# Patient Record
Sex: Female | Born: 1935 | Race: White | Hispanic: No | Smoking: Former smoker
Health system: Southern US, Community
[De-identification: ages and names within clinical notes are randomized; demographics above are authoritative.]

## PROBLEM LIST (undated history)

## (undated) DIAGNOSIS — J45909 Unspecified asthma, uncomplicated: Secondary | ICD-10-CM

## (undated) DIAGNOSIS — M5430 Sciatica, unspecified side: Secondary | ICD-10-CM

## (undated) DIAGNOSIS — L719 Rosacea, unspecified: Secondary | ICD-10-CM

## (undated) DIAGNOSIS — L309 Dermatitis, unspecified: Secondary | ICD-10-CM

## (undated) DIAGNOSIS — L509 Urticaria, unspecified: Secondary | ICD-10-CM

## (undated) DIAGNOSIS — I10 Essential (primary) hypertension: Secondary | ICD-10-CM

## (undated) DIAGNOSIS — M549 Dorsalgia, unspecified: Secondary | ICD-10-CM

## (undated) DIAGNOSIS — IMO0002 Reserved for concepts with insufficient information to code with codable children: Secondary | ICD-10-CM

## (undated) DIAGNOSIS — G8929 Other chronic pain: Secondary | ICD-10-CM

## (undated) DIAGNOSIS — E785 Hyperlipidemia, unspecified: Secondary | ICD-10-CM

## (undated) DIAGNOSIS — I639 Cerebral infarction, unspecified: Secondary | ICD-10-CM

## (undated) HISTORY — DX: Hyperlipidemia, unspecified: E78.5

## (undated) HISTORY — DX: Essential (primary) hypertension: I10

## (undated) HISTORY — DX: Unspecified asthma, uncomplicated: J45.909

## (undated) HISTORY — DX: Urticaria, unspecified: L50.9

## (undated) HISTORY — DX: Reserved for concepts with insufficient information to code with codable children: IMO0002

## (undated) HISTORY — DX: Other chronic pain: G89.29

## (undated) HISTORY — DX: Sciatica, unspecified side: M54.30

## (undated) HISTORY — DX: Dermatitis, unspecified: L30.9

## (undated) HISTORY — DX: Cerebral infarction, unspecified: I63.9

## (undated) HISTORY — DX: Dorsalgia, unspecified: M54.9

## (undated) HISTORY — DX: Rosacea, unspecified: L71.9

---

## 1981-07-24 DIAGNOSIS — I639 Cerebral infarction, unspecified: Secondary | ICD-10-CM

## 1981-07-24 HISTORY — DX: Cerebral infarction, unspecified: I63.9

## 2006-03-30 ENCOUNTER — Ambulatory Visit: Payer: Self-pay | Admitting: Family Medicine

## 2006-07-02 ENCOUNTER — Other Ambulatory Visit: Admission: RE | Admit: 2006-07-02 | Discharge: 2006-07-02 | Payer: Self-pay | Admitting: Family Medicine

## 2006-07-02 ENCOUNTER — Encounter: Payer: Self-pay | Admitting: Family Medicine

## 2006-07-02 ENCOUNTER — Ambulatory Visit: Payer: Self-pay | Admitting: Family Medicine

## 2006-08-03 ENCOUNTER — Ambulatory Visit: Payer: Self-pay | Admitting: Family Medicine

## 2006-08-03 LAB — CONVERTED CEMR LAB
ALT: 12 units/L (ref 0–40)
Basophils Absolute: 0 10*3/uL (ref 0.0–0.1)
Basophils Relative: 0.5 % (ref 0.0–1.0)
Creatinine, Ser: 0.9 mg/dL (ref 0.4–1.2)
Eosinophil percent: 1.8 % (ref 0.0–5.0)
GFR calc non Af Amer: 64 mL/min
HCT: 44.8 % (ref 36.0–46.0)
Hemoglobin: 14.5 g/dL (ref 12.0–15.0)
Lymphocytes Relative: 32.4 % (ref 12.0–46.0)
MCV: 91 fL (ref 78.0–100.0)
Monocytes Absolute: 0.6 10*3/uL (ref 0.2–0.7)
Neutro Abs: 3.8 10*3/uL (ref 1.4–7.7)
Neutrophils Relative %: 56.9 % (ref 43.0–77.0)
Platelets: 354 10*3/uL (ref 150–400)
Triglyceride fasting, serum: 92 mg/dL (ref 0–149)
VLDL: 18 mg/dL (ref 0–40)
Vit D, 1,25-Dihydroxy: 37 (ref 20–57)
WBC: 6.6 10*3/uL (ref 4.5–10.5)

## 2008-05-18 ENCOUNTER — Ambulatory Visit: Payer: Self-pay | Admitting: Family Medicine

## 2008-05-18 DIAGNOSIS — J45909 Unspecified asthma, uncomplicated: Secondary | ICD-10-CM | POA: Insufficient documentation

## 2008-05-18 DIAGNOSIS — E785 Hyperlipidemia, unspecified: Secondary | ICD-10-CM

## 2008-05-18 DIAGNOSIS — I10 Essential (primary) hypertension: Secondary | ICD-10-CM

## 2008-05-18 DIAGNOSIS — E559 Vitamin D deficiency, unspecified: Secondary | ICD-10-CM | POA: Insufficient documentation

## 2008-05-18 DIAGNOSIS — J309 Allergic rhinitis, unspecified: Secondary | ICD-10-CM

## 2008-05-20 LAB — CONVERTED CEMR LAB
AST: 26 units/L (ref 0–37)
Albumin: 3.8 g/dL (ref 3.5–5.2)
Basophils Absolute: 0 10*3/uL (ref 0.0–0.1)
Basophils Relative: 0.1 % (ref 0.0–3.0)
CO2: 32 meq/L (ref 19–32)
Calcium: 8.9 mg/dL (ref 8.4–10.5)
Cholesterol: 176 mg/dL (ref 0–200)
GFR calc Af Amer: 89 mL/min
GFR calc non Af Amer: 73 mL/min
Hemoglobin: 15.1 g/dL — ABNORMAL HIGH (ref 12.0–15.0)
LDL Cholesterol: 112 mg/dL — ABNORMAL HIGH (ref 0–99)
Lymphocytes Relative: 27.6 % (ref 12.0–46.0)
MCHC: 34.3 g/dL (ref 30.0–36.0)
MCV: 89.4 fL (ref 78.0–100.0)
Neutro Abs: 4.5 10*3/uL (ref 1.4–7.7)
Phosphorus: 3 mg/dL (ref 2.3–4.6)
RBC: 4.94 M/uL (ref 3.87–5.11)
Sodium: 142 meq/L (ref 135–145)
TSH: 0.58 microintl units/mL (ref 0.35–5.50)
Total Protein: 7.4 g/dL (ref 6.0–8.3)

## 2008-11-02 ENCOUNTER — Ambulatory Visit: Payer: Self-pay | Admitting: Family Medicine

## 2009-10-25 ENCOUNTER — Telehealth: Payer: Self-pay | Admitting: Family Medicine

## 2009-10-29 ENCOUNTER — Ambulatory Visit: Payer: Self-pay | Admitting: Family Medicine

## 2009-10-29 DIAGNOSIS — L509 Urticaria, unspecified: Secondary | ICD-10-CM

## 2009-11-01 ENCOUNTER — Encounter: Payer: Self-pay | Admitting: Family Medicine

## 2009-11-01 LAB — CONVERTED CEMR LAB
ALT: 12 units/L (ref 0–35)
AST: 22 units/L (ref 0–37)
BUN: 15 mg/dL (ref 6–23)
Basophils Relative: 0 % (ref 0–1)
Cholesterol: 162 mg/dL (ref 0–200)
Creatinine, Ser: 0.91 mg/dL (ref 0.40–1.20)
Eosinophils Absolute: 0.1 10*3/uL (ref 0.0–0.7)
Eosinophils Relative: 1 % (ref 0–5)
HDL: 52 mg/dL (ref >39)
MCHC: 32.4 g/dL (ref 30.0–36.0)
MCV: 90.9 fL (ref 78.0–100.0)
Neutrophils Relative %: 60 % (ref 43–77)
Platelets: 351 10*3/uL (ref 150–400)
RDW: 13.4 % (ref 11.5–15.5)
TSH: 0.705 microintl units/mL (ref 0.350–4.500)
Total Bilirubin: 0.5 mg/dL (ref 0.3–1.2)
Total CHOL/HDL Ratio: 3.1
VLDL: 21 mg/dL (ref 0–40)
Vit D, 25-Hydroxy: 56 ng/mL (ref 30–89)

## 2010-08-23 NOTE — Assessment & Plan Note (Signed)
Summary: med refill/ alc   Vital Signs:  Patient profile:   77 year old female Weight:      180.75 pounds Temp:     98.2 degrees F oral Pulse rate:   80 / minute Pulse rhythm:   regular BP sitting:   132 / 82  (right arm) Cuff size:   large  Vitals Entered By: Linde Gillis CMA Duncan Dull) (October 29, 2009 1:53 PM) CC: medication refill   History of Present Illness: here for f/u of HTN and lipids and vit D def  wt is down 8 lb from last visit  has really been trying   wants to get lab work   has chronic itching for 2 years -- lesions on her arms - ? 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 - this worries her  never had any symptoms from it  had abx - and was treated   has continued her vit D -- otc , does actually get 50,000 international units per week -- but never came back for her labs - no transportation    Allergies: 1)  Asa  Past History:  Family History: Last updated: 05/18/2008 mother liver cancer and arthritis  half brother with colon cancer   Social History: Last updated: 05/18/2008 former smoker  married twice - 2nd husband died in 11-30-22 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 urticaria    chiropractor --  Review of Systems General:  Denies fatigue, fever, loss of appetite, and malaise. Eyes:  Denies blurring and eye irritation. ENT:  Denies sinus pressure and sore throat. CV:  Denies chest pain or discomfort, lightheadness, palpitations, and shortness of breath with  exertion. Resp:  Denies cough, shortness of breath, and wheezing. GI:  Denies abdominal pain, bloody stools, and change in bowel habits. GU:  Denies dysuria, hematuria, and urinary frequency. MS:  Complains of joint pain and stiffness; denies joint redness, joint swelling, and muscle aches. Derm:  Complains of itching and rash; denies lesion(s) and poor wound healing. Neuro:  Denies numbness and tingling. Psych:  Denies anxiety and depression. Endo:  Denies cold intolerance, excessive thirst, excessive urination, and heat intolerance. Heme:  Denies abnormal bruising and bleeding. Allergy:  Complains of hives or rash and seasonal allergies.  Physical Exam  General:  overweight but generally well appearing  Head:  normocephalic, atraumatic, and no abnormalities observed.   Eyes:  vision grossly intact, pupils equal, pupils round, and pupils reactive to light.  no conjunctival pallor, injection or icterus  Ears:  R ear normal and L ear normal.   Nose:  nares are boggy with some clear rhinorrhea  Mouth:  pharynx pink and moist.   Neck:  supple with full rom and no masses or thyromegally, no JVD or carotid bruit  Chest Wall:  No deformities, masses, or tenderness noted. Lungs:  Normal respiratory effort, chest expands symmetrically. Lungs are clear to auscultation, no crackles or wheezes. Heart:  Normal rate and regular rhythm. S1  and S2 normal without gallop, murmur, click, rub or other extra sounds. Abdomen:  Bowel sounds positive,abdomen soft and non-tender without masses, organomegaly or hernias noted. no renal bruits  Msk:  No deformity or scoliosis noted of thoracic or lumbar spine.  no acute joint changes Pulses:  R and L carotid,radial,femoral,dorsalis pedis and posterior tibial pulses are full and equal bilaterally Extremities:  No clubbing, cyanosis, edema, or deformity noted with normal full range of motion of all joints.   Neurologic:  sensation intact to light touch, gait normal,  and DTRs symmetrical and normal.   Skin:  notable dermographia on arms - red marks after being touched  scars on upper shoulders from picking few red whelps erythema on waistline and under bra - also whelp- like  Cervical Nodes:  No lymphadenopathy noted Inguinal Nodes:  No significant adenopathy Psych:  very talkative and tangential  seems generally unhappy but not depressed good eye contact    Impression & Recommendations:  Problem # 1:  URTICARIA (ICD-708.9) Assessment New bothersome with sensitive skin and dermographia and occ wheeze strong fam hx of allergies offered allergist ref- she will think about it and call back  in meantime - antihistamines prn  Problem # 2:  UNSPECIFIED VITAMIN D DEFICIENCY (ICD-268.9) Assessment: Unchanged per pt on 50,000 international units otc weekly  check level and update disc importance of compliance and follow up of this  Orders: Venipuncture (16109) T-Renal Function Panel [id number]) T-Lipid Profile [id number]) T-Hepatic Function [id number]) T-CBC w/Diff ([id number]) T-TSH ([id number]) T- * Misc. Laboratory test [id number]) Specimen Handling (03474) Specimen Handling (25956)  Problem # 3:  HYPERTENSION (ICD-401.9) Assessment: Unchanged  well controlled with atenolol urged to stay active and keep working on wt loss  Her updated medication list for this problem includes:    Atenolol 25 Mg Tabs (Atenolol) .Marland Kitchen... 1 by mouth once daily  Orders: Venipuncture (38756) T-Renal Function Panel [id number]) T-Lipid Profile [id number]) T-Hepatic Function [id number]) T-CBC w/Diff [id number]) T-TSH [id number]) T- * Misc. Laboratory test [id number]) Specimen Handling (73710)  BP today: 132/82 Prior BP: 130/80 (05/18/2008)  Labs Reviewed: K+: 4.1 (05/18/2008) Creat: : 0.8 (05/18/2008)   Chol: 176 (05/18/2008)   HDL: 48.2 (05/18/2008)   LDL: 112 (05/18/2008)   TG: 79 (05/18/2008)  Problem # 4:  HYPERLIPIDEMIA  (ICD-272.4) Assessment: Unchanged  diet controlled not bad at last check rev sat fats in diet  lab today and adv  Orders: Venipuncture (62694) T-Renal Function Panel [id number]) T-Lipid Profile [id number]) T-Hepatic Function [id number]) T-CBC w/Diff (10175-10258) T-TSH [id number]) T- * Misc. Laboratory test [id number]) Specimen Handling (31540)  Labs Reviewed: SGOT: 26 (05/18/2008)   SGPT: 14 (05/18/2008)   HDL:48.2 (05/18/2008), 48.4 (08/03/2006)  LDL:112 (05/18/2008), 111 (08/03/2006)  Chol:176 (05/18/2008), 178 (08/03/2006)  Trig:79 (05/18/2008), 92 (08/03/2006)  Complete Medication List: 1)  Atenolol 25 Mg Tabs (Atenolol) .Marland Kitchen.. 1 by mouth once daily 2)  Calcium  .... Over the counter 3)  Fish Oil  .... 2 by mouth once daily over the counter 4)  Vitamin C (? 500 Mg)  .... 2 by mouth once daily 5)  Zinc  .... 1 by mouth once daily over the counter 6)  Claritin 10 Mg Tabs (Loratadine) .Marland Kitchen.. 1 by mouth once daily as needed runny nose or itchy skin 7)  Vit D 10,000 Iu  .... 5 by mouth times one one time weekly  Patient Instructions: 1)  we will check labs today 2)  keep working on healthy diet and weight loss  3)  blood pressure  is ok today  4)  if or when you are interested in seeing an allergist for your rash and wheezing- just call and I will do a referral  5)  I sent your px to your pharmacy  Prescriptions: ATENOLOL 25 MG TABS (ATENOLOL) 1 by mouth once daily  #30 x 11   Entered and Authorized by:   Judith Part MD   Signed by:   Judith Part MD on 10/29/2009   Method used:   Electronically to        [id number]       [address]       [place], [place]  [address]       Ph: [phone] or [phone]       Fax: [phone]   RxID:   [id number]   Current Allergies (reviewed today): ASA

## 2010-08-23 NOTE — Progress Notes (Signed)
Summary: Atenolol  Phone Note Refill Request Call back at Home Phone [phone] Message from:  Patient on October 25, 2009 5:00 PM  Refills Requested: Medication #1:  ATENOLOL 25 MG TABS 1 by mouth once daily Patient has an appt. on Friday, but only has enough med for today. Can she get enough to last until she is seen. Uses [facility] [address]   Initial call taken by: Melody Comas,  October 25, 2009 5:01 PM Call For: Judith Part MD  Follow-up for Phone Call        px written on EMR for call in  Follow-up by: Judith Part MD,  October 25, 2009 5:13 PM  Additional Follow-up for Phone Call Additional follow up Details #1::        Patient notified as instructed by telephone. Medication phoned to [facility] [address] [facility] as instructed. Lewanda Rife LPN  October 25, 2128 5:39 PM     New/Updated Medications: ATENOLOL 25 MG TABS (ATENOLOL) 1 by mouth once daily Prescriptions: ATENOLOL 25 MG TABS (ATENOLOL) 1 by mouth once daily  #30 x 0   Entered and Authorized by:   Judith Part MD   Signed by:   Lewanda Rife LPN on 86/57/8469   Method used:   Telephoned to ...         RxID:   [id number]

## 2010-08-23 NOTE — Miscellaneous (Signed)
Summary: Vitamin D 2000iu update  Clinical Lists Changes  Medications: Added new medication of VITAMIN D 2000 UNIT TABS (CHOLECALCIFEROL) take two Tablets by mouth daily Removed medication of * VIT D 10,000 IU 5 by mouth times one one time weekly     Current Allergies: ASA

## 2010-11-15 ENCOUNTER — Other Ambulatory Visit: Payer: Self-pay | Admitting: *Deleted

## 2010-11-15 MED ORDER — ATENOLOL 25 MG PO TABS: 25.0000 mg | ORAL_TABLET | Freq: Every day | ORAL | Status: DC

## 2010-11-15 NOTE — Telephone Encounter (Signed)
Rx refilled.

## 2010-11-17 ENCOUNTER — Other Ambulatory Visit: Payer: Self-pay | Admitting: *Deleted

## 2010-11-17 NOTE — Telephone Encounter (Signed)
Opened in error

## 2011-01-03 ENCOUNTER — Other Ambulatory Visit: Payer: Self-pay | Admitting: Family Medicine

## 2011-01-03 NOTE — Telephone Encounter (Signed)
I think she is due for f/u (? Over a year) Please schedule f/u when able Px written for call in

## 2011-01-06 ENCOUNTER — Other Ambulatory Visit: Payer: Self-pay | Admitting: Family Medicine

## 2011-01-09 ENCOUNTER — Other Ambulatory Visit: Payer: Self-pay | Admitting: *Deleted

## 2011-01-09 MED ORDER — ATENOLOL 25 MG PO TABS: 25.0000 mg | ORAL_TABLET | Freq: Every day | ORAL | Status: DC

## 2011-03-21 ENCOUNTER — Other Ambulatory Visit: Payer: Self-pay | Admitting: Family Medicine

## 2011-03-21 NOTE — Telephone Encounter (Signed)
[facility] [address] electronically request refill Atenolol 25mg  #30 x 1. Pt already scheduled 04/21/11 for CPX with Dr Milinda Antis.

## 2011-04-14 ENCOUNTER — Other Ambulatory Visit (INDEPENDENT_AMBULATORY_CARE_PROVIDER_SITE_OTHER): Payer: Medicare PPO

## 2011-04-14 DIAGNOSIS — E7800 Pure hypercholesterolemia, unspecified: Secondary | ICD-10-CM

## 2011-04-14 DIAGNOSIS — J309 Allergic rhinitis, unspecified: Secondary | ICD-10-CM

## 2011-04-14 DIAGNOSIS — E559 Vitamin D deficiency, unspecified: Secondary | ICD-10-CM

## 2011-04-14 DIAGNOSIS — I10 Essential (primary) hypertension: Secondary | ICD-10-CM

## 2011-04-14 LAB — BASIC METABOLIC PANEL
GFR: 64.18 mL/min (ref >60.00)
Glucose, Bld: 94 mg/dL (ref 70–99)
Potassium: 4.1 mEq/L (ref 3.5–5.1)
Sodium: 142 mEq/L (ref 135–145)

## 2011-04-14 LAB — HEPATIC FUNCTION PANEL
AST: 23 U/L (ref 0–37)
Albumin: 4 g/dL (ref 3.5–5.2)
Alkaline Phosphatase: 52 U/L (ref 39–117)
Bilirubin, Direct: 0 mg/dL (ref 0.0–0.3)
Total Bilirubin: 0.6 mg/dL (ref 0.3–1.2)

## 2011-04-14 LAB — LIPID PANEL
LDL Cholesterol: 74 mg/dL (ref 0–99)
Total CHOL/HDL Ratio: 3
VLDL: 16.6 mg/dL (ref 0.0–40.0)

## 2011-04-14 LAB — TSH: TSH: 0.25 u[IU]/mL — ABNORMAL LOW (ref 0.35–5.50)

## 2011-04-14 LAB — CBC WITH DIFFERENTIAL/PLATELET
Basophils Absolute: 0 10*3/uL (ref 0.0–0.1)
Eosinophils Relative: 1.9 % (ref 0.0–5.0)
MCV: 91.1 fl (ref 78.0–100.0)
Monocytes Absolute: 0.6 10*3/uL (ref 0.1–1.0)
Neutrophils Relative %: 60.7 % (ref 43.0–77.0)
Platelets: 319 10*3/uL (ref 150.0–400.0)
WBC: 6.4 10*3/uL (ref 4.5–10.5)

## 2011-04-15 LAB — VITAMIN D 25 HYDROXY (VIT D DEFICIENCY, FRACTURES): Vit D, 25-Hydroxy: 38 ng/mL (ref 30–89)

## 2011-04-20 ENCOUNTER — Encounter: Payer: Self-pay | Admitting: Family Medicine

## 2011-04-21 ENCOUNTER — Ambulatory Visit (INDEPENDENT_AMBULATORY_CARE_PROVIDER_SITE_OTHER): Payer: Medicare PPO | Admitting: Family Medicine

## 2011-04-21 ENCOUNTER — Encounter: Payer: Self-pay | Admitting: Family Medicine

## 2011-04-21 VITALS — BP 124/84 | HR 80 | Temp 97.8°F | Ht <= 58 in | Wt 171.2 lb

## 2011-04-21 DIAGNOSIS — E049 Nontoxic goiter, unspecified: Secondary | ICD-10-CM

## 2011-04-21 DIAGNOSIS — R946 Abnormal results of thyroid function studies: Secondary | ICD-10-CM

## 2011-04-21 DIAGNOSIS — H269 Unspecified cataract: Secondary | ICD-10-CM

## 2011-04-21 DIAGNOSIS — R7989 Other specified abnormal findings of blood chemistry: Secondary | ICD-10-CM | POA: Insufficient documentation

## 2011-04-21 DIAGNOSIS — I10 Essential (primary) hypertension: Secondary | ICD-10-CM

## 2011-04-21 DIAGNOSIS — E559 Vitamin D deficiency, unspecified: Secondary | ICD-10-CM

## 2011-04-21 DIAGNOSIS — E785 Hyperlipidemia, unspecified: Secondary | ICD-10-CM

## 2011-04-21 LAB — T3 UPTAKE: T3 Uptake: 36.9 % (ref 22.5–37.0)

## 2011-04-21 NOTE — Assessment & Plan Note (Signed)
Stable level Urged to continue current dose

## 2011-04-21 NOTE — Assessment & Plan Note (Signed)
Refer for this and vision exam- pt is having more problems

## 2011-04-21 NOTE — Progress Notes (Signed)
Subjective:    Patient ID: Yesenia Gomez, female    DOB: [birth year], 77 y.o.   MRN: [id number]  HPI Here for check up of chronic med problems and to review health mt list   Is upset because she has lost inches in ht  Stress- son is bed ridden with strokes   HTN in good control wth 124/84 today on tenormin , nl pulse No cp or ha or palpitations   Wt is down 9 lb with bmi of 36 Not a lot of appetite  Nutrition- is generally eating less  She does like to cook - that is how she gains her weight   Activity - takes care of her son   Has cataracts and needs check up of eyes (saw Dr Larence Penning in past - wants to see someone else)   Lots of allergy problems  Lipids are good  Lab Results  Component Value Date   CHOL 145 04/14/2011   CHOL 162 10/29/2009   CHOL 176 05/18/2008   Lab Results  Component Value Date   HDL 54.00 04/14/2011   HDL 52 10/29/2009   HDL 48.2 05/18/2008   Lab Results  Component Value Date   LDLCALC 74 04/14/2011   LDLCALC 89 10/29/2009   LDLCALC 112* 05/18/2008   Lab Results  Component Value Date   TRIG 83.0 04/14/2011   TRIG 103 10/29/2009   TRIG 79 05/18/2008   Lab Results  Component Value Date   CHOLHDL 3 04/14/2011   CHOLHDL 3.1 Ratio 10/29/2009   CHOLHDL 3.7 CALC 05/18/2008   No results found for this basename: LDLDIRECT   she generally eats a little less Has to watch out for the pastries   tsh low today - this is new  Thought she may have had a goiter in the past  No trouble sleeping, not hyper , but a little more anxious   Colon ca screening - no interested   Zoster status- never had shingles and ? If chicken pox ? If interested in vaccine   Flu shot- declines  Pneumovax- declines  Td 07  Mammogram - had one time and does not want any more  Does not want a breast exam   Pap neg in 12/07 Tends to get yeast infections  Has to stay away from acidic foods and beverages - uses otc meds  Does not want a gyn exam today   Review of Systems  Review of  Systems  Constitutional: Negative for fever, appetite change, fatigue and unexpected weight change.  Eyes: Negative for pain and pos for blurry vision  Respiratory: Negative for cough and shortness of breath.   Cardiovascular: Negative for cp or palpitations    Gastrointestinal: Negative for nausea, diarrhea and constipation.  Genitourinary: Negative for urgency and frequency.  Skin: Negative for pallor or rash   Neurological: Negative for weakness, light-headedness, numbness and headaches.  Hematological: Negative for adenopathy. Does not bruise/bleed easily.  Psychiatric/Behavioral: Negative for dysphoric mood. Some mild anx, no SI      Patient Active Problem List  Diagnoses  . UNSPECIFIED VITAMIN D DEFICIENCY  . HYPERLIPIDEMIA  . HYPERTENSION  . ALLERGIC RHINITIS  . ASTHMA  . URTICARIA  . Low TSH level  . Cataracts, bilateral  . Goiter   Past Medical History  Diagnosis Date  . CVA (cerebral vascular accident) 1983    no residual deficits  . HTN (hypertension)   . Sciatica   . Rosacea   . HLD (hyperlipidemia)   .  Vitamin D deficiency   . DDD (degenerative disc disease)     LS; C5  . Chronic back pain   . Allergic rhinitis   . Dermatitis   . Urticaria, unspecified   . Unspecified asthma    No past surgical history on file. History  Substance Use Topics  . Smoking status: Former Games developer  . Smokeless tobacco: Not on file  . Alcohol Use: Not on file   Family History  Problem Relation Age of Onset  . Liver cancer Mother   . Arthritis Mother   . Colon cancer Brother     Half brother   Allergies  Allergen Reactions  . Aspirin     REACTION: reaction not known   Current Outpatient Prescriptions on File Prior to Visit  Medication Sig Dispense Refill  . atenolol (TENORMIN) 25 MG tablet TAKE 1 TABLET (25 MG TOTAL) BY MOUTH DAILY.  30 tablet  1  . Calcium Carbonate-Vitamin D (CALCIUM 600+D PO) Take by mouth as directed.        . Cholecalciferol (VITAMIN D) 2000  UNITS tablet Take 4,000 Units by mouth daily.        . fish oil-omega-3 fatty acids 1000 MG capsule Take 2 g by mouth daily.        . vitamin C (ASCORBIC ACID) 500 MG tablet Take 1,000 mg by mouth daily.        Marland Kitchen loratadine (CLARITIN) 10 MG tablet Take 10 mg by mouth daily as needed.        . Multiple Vitamins-Minerals (ZINC PO) Take by mouth daily.              Objective:   Physical Exam  Constitutional: She appears well-developed and well-nourished. No distress.       overwt and well appearing   HENT:  Head: Normocephalic and atraumatic.  Eyes: Conjunctivae and EOM are normal. Pupils are equal, round, and reactive to light. Right eye exhibits no discharge. Left eye exhibits no discharge.  Neck: Normal range of motion. Neck supple. No JVD present. Carotid bruit is not present. No tracheal deviation present. Thyromegaly present.  Cardiovascular: Normal rate, regular rhythm, normal heart sounds and intact distal pulses.  Exam reveals no gallop.   Pulmonary/Chest: Effort normal and breath sounds normal. No respiratory distress. She has no wheezes.  Abdominal: Soft. Bowel sounds are normal. She exhibits no distension, no abdominal bruit and no mass. There is no tenderness.  Musculoskeletal: Normal range of motion. She exhibits no edema and no tenderness.  Lymphadenopathy:    She has no cervical adenopathy.  Neurological: She is alert. She has normal reflexes. No cranial nerve deficit. She exhibits normal muscle tone. Coordination normal.  Skin: Skin is warm and dry. No rash noted. No erythema. No pallor.  Psychiatric: She has a normal mood and affect.          Assessment & Plan:

## 2011-04-21 NOTE — Assessment & Plan Note (Signed)
bp is well controlled with tenormin Will continue to monitor Enc further wt loss and good exercise

## 2011-04-21 NOTE — Assessment & Plan Note (Signed)
This is new and noted thyroid enl? On L Thyroid prof today Also thyroid US

## 2011-04-21 NOTE — Patient Instructions (Addendum)
We will do eye doctor referral at check out  If you are interested in shingles vaccine in future - call your insurance company to see how coverage is and call us to schedule  Labs look ok  Stay as active as you can  If you want to see someone for your foot/ toe problem- you can make an appointment with Dr Patsy Lager (our sports medicine doctorI) in the future  More thyroid labs today- your thyroid may be overactive  Also we will refer you for ultrasound of thyroid- you may have a goiter

## 2011-04-21 NOTE — Assessment & Plan Note (Signed)
Low tsh and L sided thyroid fullness noted on exam  Thyroid panel today Ref for [place]  Then advise

## 2011-04-21 NOTE — Assessment & Plan Note (Signed)
This is well controlled by diet Rev lab with pt  Rev low sat fat diet

## 2011-05-08 ENCOUNTER — Other Ambulatory Visit: Payer: Self-pay | Admitting: *Deleted

## 2011-05-08 MED ORDER — ATENOLOL 25 MG PO TABS: ORAL_TABLET | ORAL | Status: DC

## 2011-06-06 ENCOUNTER — Ambulatory Visit: Payer: Self-pay | Admitting: Ophthalmology

## 2011-06-07 ENCOUNTER — Other Ambulatory Visit: Payer: Self-pay | Admitting: Family Medicine

## 2011-06-07 NOTE — Telephone Encounter (Signed)
[facility] [address] request refill Atenolol 25 mg # 30 x 6. Pt seen on 04/21/11.

## 2011-06-14 ENCOUNTER — Ambulatory Visit: Payer: Self-pay | Admitting: Ophthalmology

## 2011-09-25 ENCOUNTER — Other Ambulatory Visit: Payer: Self-pay | Admitting: Family Medicine

## 2012-05-05 ENCOUNTER — Other Ambulatory Visit: Payer: Self-pay | Admitting: Family Medicine

## 2012-05-06 NOTE — Telephone Encounter (Signed)
Ok to refill? No recent appt or labs

## 2012-05-06 NOTE — Telephone Encounter (Signed)
Please schedule a f/u appt this fall or winter and refil med until then, thanks

## 2012-05-06 NOTE — Telephone Encounter (Signed)
Called pt but no answer and no voicemail set up, will try to call back later 

## 2012-05-06 NOTE — Telephone Encounter (Signed)
Please make pt f/u appt fall or winter and refil med until then, thanks

## 2012-05-06 NOTE — Telephone Encounter (Signed)
Ok to refill? No recent appt or labs 

## 2012-05-06 NOTE — Telephone Encounter (Signed)
appt scheduled for 06/07/12, med refilled till appt.

## 2012-06-07 ENCOUNTER — Encounter: Payer: Self-pay | Admitting: Family Medicine

## 2012-06-07 ENCOUNTER — Ambulatory Visit (INDEPENDENT_AMBULATORY_CARE_PROVIDER_SITE_OTHER): Payer: Medicare PPO | Admitting: Family Medicine

## 2012-06-07 VITALS — BP 135/80 | HR 62 | Temp 98.0°F | Ht <= 58 in | Wt 168.5 lb

## 2012-06-07 DIAGNOSIS — R7989 Other specified abnormal findings of blood chemistry: Secondary | ICD-10-CM

## 2012-06-07 DIAGNOSIS — E785 Hyperlipidemia, unspecified: Secondary | ICD-10-CM

## 2012-06-07 DIAGNOSIS — E559 Vitamin D deficiency, unspecified: Secondary | ICD-10-CM

## 2012-06-07 DIAGNOSIS — I10 Essential (primary) hypertension: Secondary | ICD-10-CM

## 2012-06-07 DIAGNOSIS — R946 Abnormal results of thyroid function studies: Secondary | ICD-10-CM

## 2012-06-07 DIAGNOSIS — E049 Nontoxic goiter, unspecified: Secondary | ICD-10-CM

## 2012-06-07 LAB — CBC WITH DIFFERENTIAL/PLATELET
Basophils Absolute: 0 10*3/uL (ref 0.0–0.1)
Basophils Relative: 0 % (ref 0–1)
Eosinophils Absolute: 0.1 10*3/uL (ref 0.0–0.7)
HCT: 43 % (ref 36.0–46.0)
Hemoglobin: 14.9 g/dL (ref 12.0–15.0)
MCH: 30.2 pg (ref 26.0–34.0)
MCHC: 34.7 g/dL (ref 30.0–36.0)
Monocytes Absolute: 0.8 10*3/uL (ref 0.1–1.0)
Monocytes Relative: 9 % (ref 3–12)
Neutro Abs: 5.3 10*3/uL (ref 1.7–7.7)
Neutrophils Relative %: 60 % (ref 43–77)
RDW: 14 % (ref 11.5–15.5)

## 2012-06-07 LAB — COMPREHENSIVE METABOLIC PANEL
AST: 19 U/L (ref 0–37)
Alkaline Phosphatase: 59 U/L (ref 39–117)
BUN: 18 mg/dL (ref 6–23)
Glucose, Bld: 90 mg/dL (ref 70–99)
Potassium: 4.5 mEq/L (ref 3.5–5.3)
Total Bilirubin: 0.5 mg/dL (ref 0.3–1.2)

## 2012-06-07 NOTE — Progress Notes (Signed)
Subjective:    Patient ID: Yesenia Gomez, female    DOB: Oct 06, 1924, 78 y.o.   MRN: [id number]  HPI Here for f/u of chronic health problems   bp is up  today  No cp or palpitations or headaches or edema  No side effects to medicines  BP Readings from Last 3 Encounters:  06/07/12 158/88  04/21/11 124/84  10/29/09 132/82    She blames increased bp on stress- also took claritin D  Living with in laws - difficult  Argues about pets quite a bit - has to confine her dog - a lot of stressors   She is a bit allergic to her dog - some skin itching  Also mail and newsprint bother her  Has tried benadryl Chlorpheniramine 4 mg works better  Her eyes get itchy in the mornings also  Had her cataract removed - that went well  Wt  is down 3 lb with bmi of 36  Hx of suspected goiter/ thyroid enlargement , and she never had the ultrasound she was referred for  Also low tsh Lab Results  Component Value Date   TSH 0.20* 04/21/2011   she does get leg cramps at night  She never went for her thyroid US because she was sick  She also has transportation issues and she cares for her bedridden son (cva and DM and htn) Will refer her again today    Lipids Lab Results  Component Value Date   CHOL 145 04/14/2011   HDL 54.00 04/14/2011   LDLCALC 74 04/14/2011   TRIG 83.0 04/14/2011   CHOLHDL 3 04/14/2011   pretty good at last check    She declines flu and pneumonia vaccines Declines all screening or health mt   Patient Active Problem List  Diagnosis  . UNSPECIFIED VITAMIN D DEFICIENCY  . HYPERLIPIDEMIA  . HYPERTENSION  . ALLERGIC RHINITIS  . ASTHMA  . URTICARIA  . Low TSH level  . Cataracts, bilateral  . Goiter   Past Medical History  Diagnosis Date  . CVA (cerebral vascular accident) 1983    no residual deficits  . HTN (hypertension)   . Sciatica   . Rosacea   . HLD (hyperlipidemia)   . Vitamin D deficiency   . DDD (degenerative disc disease)     LS; C5  . Chronic back pain   .  Allergic rhinitis   . Dermatitis   . Urticaria, unspecified   . Unspecified asthma    No past surgical history on file. History  Substance Use Topics  . Smoking status: Former Games developer  . Smokeless tobacco: Not on file  . Alcohol Use: Yes     Comment: rare wine   Family History  Problem Relation Age of Onset  . Liver cancer Mother   . Arthritis Mother   . Colon cancer Brother     Half brother   Allergies  Allergen Reactions  . Aspirin     REACTION: reaction not known   Current Outpatient Prescriptions on File Prior to Visit  Medication Sig Dispense Refill  . atenolol (TENORMIN) 25 MG tablet TAKE 1 TABLET (25 MG TOTAL) BY MOUTH DAILY.  30 tablet  6  . Calcium Carbonate-Vitamin D (CALCIUM 600+D PO) Take 2 tablets by mouth as directed.       . chlorpheniramine (CHLOR-TRIMETON) 4 MG tablet Take 2 mg by mouth daily as needed.        . Cholecalciferol (VITAMIN D) 2000 UNITS tablet Take 4,000 Units by mouth  daily.        . Cyanocobalamin (VITAMIN B 12 PO) Take 1 tablet by mouth daily.        . fish oil-omega-3 fatty acids 1000 MG capsule Take 2 g by mouth daily.        Marland Kitchen loratadine (CLARITIN) 10 MG tablet Take 10 mg by mouth daily as needed.        . Multiple Vitamins-Minerals (ZINC PO) Take by mouth daily.        . vitamin C (ASCORBIC ACID) 500 MG tablet Take 1,000 mg by mouth daily.        . [DISCONTINUED] atenolol (TENORMIN) 25 MG tablet TAKE ONE TABLET BY MOUTH DAILY.  30 tablet  0    Review of Systems Review of Systems  Constitutional: Negative for fever, appetite change, fatigue and unexpected weight change.  Eyes: Negative for pain and visual disturbance.  Respiratory: Negative for cough and shortness of breath.   Cardiovascular: Negative for cp or palpitations    Gastrointestinal: Negative for nausea, diarrhea and constipation.  Genitourinary: Negative for urgency and frequency.  Skin: Negative for pallor and pos for itchy dry skin  Neurological: Negative for weakness,  light-headedness, numbness and headaches.  Hematological: Negative for adenopathy. Does not bruise/bleed easily.  Psychiatric/Behavioral: Negative for dysphoric mood. The patient is not nervous/anxious.  pos for a lot of stressors        Objective:   Physical Exam  Constitutional: She appears well-developed and well-nourished. No distress.  HENT:  Head: Normocephalic and atraumatic.  Mouth/Throat: Oropharynx is clear and moist.  Eyes: Conjunctivae normal and EOM are normal. Pupils are equal, round, and reactive to light. Right eye exhibits no discharge. Left eye exhibits no discharge. No scleral icterus.  Neck: Normal range of motion. Neck supple. No JVD present. Carotid bruit is not present. Thyromegaly present.       Palpable fullness in L neck - suspect goiter/ thyroid nodule nontender  Cardiovascular: Normal rate, regular rhythm, normal heart sounds and intact distal pulses.  Exam reveals no gallop.   Abdominal: Soft. Bowel sounds are normal. She exhibits no distension, no abdominal bruit and no mass. There is no tenderness.  Musculoskeletal: She exhibits no edema.  Lymphadenopathy:    She has no cervical adenopathy.  Neurological: She is alert. She has normal reflexes. No cranial nerve deficit. She exhibits normal muscle tone. Coordination normal.  Skin: Skin is warm and dry. No rash noted. No erythema. No pallor.  Psychiatric: She has a normal mood and affect.       Pleasant but very tangential - difficult to follow at times           Assessment & Plan:

## 2012-06-07 NOTE — Patient Instructions (Addendum)
Labs today We will refer you for thyroid ultrasound at check out  Stay as active as you can be

## 2012-06-08 LAB — VITAMIN D 25 HYDROXY (VIT D DEFICIENCY, FRACTURES): Vit D, 25-Hydroxy: 33 ng/mL (ref 30–89)

## 2012-06-09 NOTE — Assessment & Plan Note (Signed)
Mass felt in L side of neck seems stable - suspect thyroid enl or nodule  Pt did not follow through with thyroid US that was sched last year- so was ref again  tsh also has been low  Pt has transportation issues- she has difficulty following up or going to doctors in general  Lab today

## 2012-06-09 NOTE — Assessment & Plan Note (Signed)
Lab today  Disc imp to bone and overall health 

## 2012-06-09 NOTE — Assessment & Plan Note (Signed)
bp is up today - but pt had taken some claritin D  Will continue to monitor- pt indicates it has been fine No symptoms Disc lifestyle habits Lab today

## 2012-06-09 NOTE — Assessment & Plan Note (Signed)
Re check this and thyroid US  Pt will likely need to see endocrinologist - but she will probably decline this  No symptoms but she has lost some wt

## 2012-06-09 NOTE — Assessment & Plan Note (Signed)
Lipids were very good at last check  Diet is fair

## 2012-06-10 ENCOUNTER — Encounter: Payer: Self-pay | Admitting: *Deleted

## 2012-07-02 ENCOUNTER — Other Ambulatory Visit: Payer: Self-pay | Admitting: Family Medicine

## 2012-07-05 ENCOUNTER — Ambulatory Visit: Payer: Self-pay | Admitting: Family Medicine

## 2012-07-08 ENCOUNTER — Encounter: Payer: Self-pay | Admitting: Family Medicine

## 2012-07-11 ENCOUNTER — Telehealth: Payer: Self-pay | Admitting: Family Medicine

## 2012-07-11 DIAGNOSIS — E049 Nontoxic goiter, unspecified: Secondary | ICD-10-CM

## 2012-07-11 DIAGNOSIS — R7989 Other specified abnormal findings of blood chemistry: Secondary | ICD-10-CM

## 2012-07-11 NOTE — Telephone Encounter (Signed)
Endocrine ref  

## 2012-07-11 NOTE — Telephone Encounter (Signed)
Message copied by Judy Pimple on Thu Jul 11, 2012  3:33 PM ------      Message from: Shon Millet      Created: Thu Jul 11, 2012 12:33 PM       Pt said that is does want to talk to the endocrinologist she just needs an appt after holidays, I advise pt that Shirlee Limerick would call her and scheduled the appt with a time that works for her, please pt referral in

## 2012-08-06 ENCOUNTER — Ambulatory Visit: Payer: Medicare PPO | Admitting: Internal Medicine

## 2013-01-25 ENCOUNTER — Other Ambulatory Visit: Payer: Self-pay | Admitting: Family Medicine

## 2013-01-27 NOTE — Telephone Encounter (Signed)
Please schedule follow up (medicare PE if she wants one) for fall and refill until then, thanks

## 2013-01-27 NOTE — Telephone Encounter (Signed)
Electronic refill request, no recent/future appt., please advise  

## 2013-01-29 NOTE — Telephone Encounter (Signed)
Called pt and left message with family to have pt call office

## 2013-01-31 NOTE — Telephone Encounter (Signed)
Called pt and no answer and no voicemail set up 

## 2013-02-04 NOTE — Telephone Encounter (Signed)
F/u scheduled to discuss several issues pt has been having recently, and med refill

## 2013-02-14 ENCOUNTER — Ambulatory Visit: Payer: Medicare PPO | Admitting: Family Medicine

## 2013-03-04 ENCOUNTER — Ambulatory Visit: Payer: Medicare PPO | Admitting: Family Medicine

## 2013-03-18 ENCOUNTER — Encounter: Payer: Self-pay | Admitting: Family Medicine

## 2013-03-18 ENCOUNTER — Ambulatory Visit (INDEPENDENT_AMBULATORY_CARE_PROVIDER_SITE_OTHER): Payer: Medicare PPO | Admitting: Family Medicine

## 2013-03-18 ENCOUNTER — Ambulatory Visit (INDEPENDENT_AMBULATORY_CARE_PROVIDER_SITE_OTHER)
Admission: RE | Admit: 2013-03-18 | Discharge: 2013-03-18 | Disposition: A | Discharge status: Home / Self Care | Payer: Medicare PPO | Source: Ambulatory Visit | Attending: Family Medicine | Admitting: Family Medicine

## 2013-03-18 VITALS — BP 146/90 | HR 79 | Temp 98.0°F | Ht <= 58 in | Wt 164.2 lb

## 2013-03-18 DIAGNOSIS — IMO0001 Reserved for inherently not codable concepts without codable children: Secondary | ICD-10-CM

## 2013-03-18 DIAGNOSIS — M2550 Pain in unspecified joint: Secondary | ICD-10-CM | POA: Insufficient documentation

## 2013-03-18 DIAGNOSIS — M25569 Pain in unspecified knee: Secondary | ICD-10-CM

## 2013-03-18 DIAGNOSIS — M25561 Pain in right knee: Secondary | ICD-10-CM

## 2013-03-18 DIAGNOSIS — B379 Candidiasis, unspecified: Secondary | ICD-10-CM

## 2013-03-18 DIAGNOSIS — M791 Myalgia, unspecified site: Secondary | ICD-10-CM | POA: Insufficient documentation

## 2013-03-18 DIAGNOSIS — I10 Essential (primary) hypertension: Secondary | ICD-10-CM

## 2013-03-18 MED ORDER — TERCONAZOLE 0.4 % VA CREA: TOPICAL_CREAM | VAGINAL | Status: AC

## 2013-03-18 MED ORDER — ATENOLOL 25 MG PO TABS: 25.0000 mg | ORAL_TABLET | Freq: Every day | ORAL | Status: DC

## 2013-03-18 NOTE — Assessment & Plan Note (Signed)
bp in fair control at this time  No changes needed  Disc lifstyle change with low sodium diet and exercise  Improved on 2nd check while sitting Pt is unconcerned about it

## 2013-03-18 NOTE — Assessment & Plan Note (Signed)
With arthralgias Check ESR and CPK Suspect oa plays a role as does age and deconditioning
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
## 2013-03-18 NOTE — Assessment & Plan Note (Signed)
Per pt - some skin fold itchign Given px for terazol to try externally inst to keep areas clean and very dry

## 2013-03-18 NOTE — Patient Instructions (Addendum)
Labs today for muscle and joint pain  Xray of your right knee today  Blood pressure was better on 2nd check  I sent a px for the cream for yeast to your pharmacy

## 2013-03-18 NOTE — Assessment & Plan Note (Signed)
Xray today Pt has multiple MSK c/o but does not want to take med I suggested tylenol

## 2013-04-25 ENCOUNTER — Other Ambulatory Visit: Payer: Self-pay

## 2013-04-25 NOTE — Telephone Encounter (Signed)
Annice Pih pts daughter in law wanted Dr Milinda Antis to be aware pt is going to start using [facility]; [facility] will contact our office about refills. Annice Pih does not want any refills done now until [facility] requests refill.

## 2013-04-28 ENCOUNTER — Other Ambulatory Visit: Payer: Self-pay | Admitting: *Deleted

## 2013-04-28 MED ORDER — ATENOLOL 25 MG PO TABS: 25.0000 mg | ORAL_TABLET | Freq: Every day | ORAL | Status: DC

## 2013-04-28 NOTE — Telephone Encounter (Signed)
Fax refill request.

## 2014-06-16 ENCOUNTER — Ambulatory Visit (INDEPENDENT_AMBULATORY_CARE_PROVIDER_SITE_OTHER): Payer: Medicare PPO | Admitting: Family Medicine

## 2014-06-16 ENCOUNTER — Encounter: Payer: Self-pay | Admitting: Family Medicine

## 2014-06-16 VITALS — BP 136/82 | HR 69 | Temp 98.1°F | Ht <= 58 in | Wt 157.2 lb

## 2014-06-16 DIAGNOSIS — E785 Hyperlipidemia, unspecified: Secondary | ICD-10-CM

## 2014-06-16 DIAGNOSIS — R5382 Chronic fatigue, unspecified: Secondary | ICD-10-CM

## 2014-06-16 DIAGNOSIS — R5383 Other fatigue: Secondary | ICD-10-CM | POA: Insufficient documentation

## 2014-06-16 DIAGNOSIS — I10 Essential (primary) hypertension: Secondary | ICD-10-CM

## 2014-06-16 LAB — CBC WITH DIFFERENTIAL/PLATELET
BASOS PCT: 0.2 % (ref 0.0–3.0)
Basophils Absolute: 0 10*3/uL (ref 0.0–0.1)
EOS PCT: 2.5 % (ref 0.0–5.0)
Eosinophils Absolute: 0.2 10*3/uL (ref 0.0–0.7)
HEMATOCRIT: 41.5 % (ref 36.0–46.0)
HEMOGLOBIN: 13.6 g/dL (ref 12.0–15.0)
LYMPHS ABS: 2.2 10*3/uL (ref 0.7–4.0)
LYMPHS PCT: 30 % (ref 12.0–46.0)
MCHC: 32.7 g/dL (ref 30.0–36.0)
MCV: 89.3 fl (ref 78.0–100.0)
MONOS PCT: 11.2 % (ref 3.0–12.0)
Monocytes Absolute: 0.8 10*3/uL (ref 0.1–1.0)
NEUTROS ABS: 4.1 10*3/uL (ref 1.4–7.7)
Neutrophils Relative %: 56.1 % (ref 43.0–77.0)
Platelets: 317 10*3/uL (ref 150.0–400.0)
RBC: 4.65 Mil/uL (ref 3.87–5.11)
RDW: 14.1 % (ref 11.5–15.5)
WBC: 7.3 10*3/uL (ref 4.0–10.5)

## 2014-06-16 LAB — LIPID PANEL
CHOL/HDL RATIO: 3
Cholesterol: 150 mg/dL (ref 0–200)
HDL: 47.6 mg/dL (ref >39.00)
LDL CALC: 87 mg/dL (ref 0–99)
NonHDL: 102.4
TRIGLYCERIDES: 79 mg/dL (ref 0.0–149.0)
VLDL: 15.8 mg/dL (ref 0.0–40.0)

## 2014-06-16 LAB — COMPREHENSIVE METABOLIC PANEL
ALT: 9 U/L (ref 0–35)
AST: 23 U/L (ref 0–37)
Albumin: 4 g/dL (ref 3.5–5.2)
Alkaline Phosphatase: 62 U/L (ref 39–117)
BUN: 21 mg/dL (ref 6–23)
CO2: 29 mEq/L (ref 19–32)
Calcium: 9.4 mg/dL (ref 8.4–10.5)
Chloride: 101 mEq/L (ref 96–112)
Creatinine, Ser: 1 mg/dL (ref 0.4–1.2)
GFR: 54.43 mL/min — ABNORMAL LOW (ref >60.00)
Glucose, Bld: 100 mg/dL — ABNORMAL HIGH (ref 70–99)
Potassium: 4.8 mEq/L (ref 3.5–5.1)
Sodium: 139 mEq/L (ref 135–145)
Total Bilirubin: 0.3 mg/dL (ref 0.2–1.2)
Total Protein: 7.3 g/dL (ref 6.0–8.3)

## 2014-06-16 LAB — VITAMIN B12: Vitamin B-12: 429 pg/mL (ref 211–911)

## 2014-06-16 LAB — TSH: TSH: 0.71 u[IU]/mL (ref 0.35–4.50)

## 2014-06-16 MED ORDER — ATENOLOL 25 MG PO TABS: 25.0000 mg | ORAL_TABLET | Freq: Every day | ORAL | Status: DC

## 2014-06-16 NOTE — Progress Notes (Signed)
Pre visit review using our clinic review tool, if applicable. No additional management support is needed unless otherwise documented below in the visit note. 

## 2014-06-16 NOTE — Patient Instructions (Signed)
Continue current medications  Your blood pressure is good  Stay active and take care of yourself  Labs today

## 2014-06-16 NOTE — Progress Notes (Signed)
Subjective:    Patient ID: Yesenia Gomez, female    DOB: 1925-03-11, 80 y.o.   MRN: [id number]  HPI Here for f/u of chronic medical problems   Doing ok overall  She is still doing housework and day to day tasks   In general has pain all over - arthritis pain  Everything is a task  She takes tylenol as needed and it helps  She takes a maximum of 2 pills per day   bp is stable today  No cp or palpitations or headaches or edema  No side effects to medicines  BP Readings from Last 3 Encounters:  06/16/14 136/82  03/18/13 146/90  06/07/12 135/80     On atenolol 25 mg  Pulse rate is 69  Wt is down 7lb with bmi of 33 She is thrilled with that  She is trying to follow the weight watchers smart diet -and doing well with that   Pt declines some immunizations  Declines most health mt   Is fatigued a lot of the time  Is interested in getting her labs  Stays cool as well    Patient Active Problem List   Diagnosis Date Noted  . Arthralgia 03/18/2013  . Myalgia 03/18/2013  . Yeast infection 03/18/2013  . Right knee pain 03/18/2013  . Low TSH level 04/21/2011  . Cataracts, bilateral 04/21/2011  . Goiter 04/21/2011  . URTICARIA 10/29/2009  . UNSPECIFIED VITAMIN D DEFICIENCY 05/18/2008  . HYPERLIPIDEMIA 05/18/2008  . HYPERTENSION 05/18/2008  . ALLERGIC RHINITIS 05/18/2008  . ASTHMA 05/18/2008   Past Medical History  Diagnosis Date  . CVA (cerebral vascular accident) 1983    no residual deficits  . HTN (hypertension)   . Sciatica   . Rosacea   . HLD (hyperlipidemia)   . Vitamin D deficiency   . DDD (degenerative disc disease)     LS; C5  . Chronic back pain   . Allergic rhinitis   . Dermatitis   . Urticaria, unspecified   . Unspecified asthma(493.90)    No past surgical history on file. History  Substance Use Topics  . Smoking status: Former Games developermoker  . Smokeless tobacco: Not on file  . Alcohol Use: 0.0 oz/week    0 Not specified per week     Comment: rare wine    Family History  Problem Relation Age of Onset  . Liver cancer Mother   . Arthritis Mother   . Colon cancer Brother     Half brother   Allergies  Allergen Reactions  . Aspirin     REACTION: reaction not known   Current Outpatient Prescriptions on File Prior to Visit  Medication Sig Dispense Refill  . atenolol (TENORMIN) 25 MG tablet Take 1 tablet (25 mg total) by mouth daily. 90 tablet 3  . Calcium Carbonate-Vitamin D (CALCIUM 600+D PO) Take 2 tablets by mouth as directed.     . Cholecalciferol (VITAMIN D) 2000 UNITS tablet Take 4,000 Units by mouth daily.      . Cyanocobalamin (VITAMIN B 12 PO) Take 1 tablet by mouth daily.      . fish oil-omega-3 fatty acids 1000 MG capsule Take 2 g by mouth daily.      . Multiple Vitamins-Minerals (ZINC PO) Take by mouth daily.      Marland Kitchen. terconazole (TERAZOL 7) 0.4 % vaginal cream Use cream once daily to affected area for yeast infection 45 g 0  . vitamin C (ASCORBIC ACID) 500 MG tablet Take 1,000  mg by mouth daily.       No current facility-administered medications on file prior to visit.       Review of Systems Review of Systems  Constitutional: Negative for fever, appetite change, fatigue and unexpected weight change.  Eyes: Negative for pain and visual disturbance.  Respiratory: Negative for cough and shortness of breath.   Cardiovascular: Negative for cp or palpitations    Gastrointestinal: Negative for nausea, diarrhea and constipation.  Genitourinary: Negative for urgency and frequency.  Skin: Negative for pallor or rash   MSK pos for arthritis pain  Neurological: Negative for weakness, light-headedness, numbness and headaches.  Hematological: Negative for adenopathy. Does not bruise/bleed easily.  Psychiatric/Behavioral: Negative for dysphoric mood. The patient is not nervous/anxious.         Objective:   Physical Exam  Constitutional: She appears well-developed and well-nourished. No distress.  HENT:  Head: Normocephalic  and atraumatic.  Mouth/Throat: Oropharynx is clear and moist.  Eyes: Conjunctivae and EOM are normal. Pupils are equal, round, and reactive to light. No scleral icterus.  Neck: Normal range of motion. Neck supple. No JVD present. Carotid bruit is not present. Thyromegaly present.  Cardiovascular: Normal rate, regular rhythm, normal heart sounds and intact distal pulses.  Exam reveals no gallop.   Pulmonary/Chest: Effort normal and breath sounds normal. No respiratory distress. She has no wheezes. She has no rales.  Abdominal: Soft. Bowel sounds are normal. She exhibits no distension and no mass. There is no tenderness.  Musculoskeletal: She exhibits no edema or tenderness.  Mild kyphosis   Peripheral joint changes consistent with OA  Lymphadenopathy:    She has no cervical adenopathy.  Neurological: She is alert. She has normal reflexes. No cranial nerve deficit. She exhibits normal muscle tone. Coordination normal.  Skin: Skin is warm and dry. No rash noted. No erythema. No pallor.  Psychiatric: She has a normal mood and affect.          Assessment & Plan:   Problem List Items Addressed This Visit      Cardiovascular and Mediastinum   Essential hypertension - Primary    bp in fair control at this time  BP Readings from Last 1 Encounters:  06/16/14 136/82   No changes needed Disc lifstyle change with low sodium diet and exercise  Labs today    Relevant Medications      atenolol (TENORMIN) tablet   Other Relevant Orders      CBC with Differential (Completed)      Comprehensive metabolic panel (Completed)      TSH (Completed)      Lipid panel (Completed)     Other   Fatigue    With cold intolerance  Labs today  Denies depression  Chronic pain from arthritis may also add to this     Relevant Orders      CBC with Differential (Completed)      TSH (Completed)      Vitamin B12 (Completed)   Hyperlipidemia    Labs today  Rev low sat fat diet Pt has been eating  better as well     Relevant Medications      atenolol (TENORMIN) tablet   Other Relevant Orders      Lipid panel (Completed)

## 2014-06-17 ENCOUNTER — Encounter: Payer: Self-pay | Admitting: *Deleted

## 2014-06-17 ENCOUNTER — Telehealth: Payer: Self-pay | Admitting: Family Medicine

## 2014-06-17 NOTE — Telephone Encounter (Signed)
emmi mailed  °

## 2014-06-20 NOTE — Assessment & Plan Note (Signed)
bp in fair control at this time  BP Readings from Last 1 Encounters:  06/16/14 136/82   No changes needed Disc lifstyle change with low sodium diet and exercise  Labs today

## 2014-06-20 NOTE — Assessment & Plan Note (Signed)
With cold intolerance  Labs today  Denies depression  Chronic pain from arthritis may also add to this

## 2014-06-20 NOTE — Assessment & Plan Note (Signed)
Labs today  Rev low sat fat diet Pt has been eating better as well

## 2015-03-17 IMAGING — CR DG KNEE COMPLETE 4+V*R*
5 series · 5 of 5 positions shown · non-contrast
Comparison: None.

CLINICAL DATA: History of right knee pain.  Medial pain.

RIGHT KNEE - COMPLETE 4+ VIEW

[view not recorded (1 of 5)]
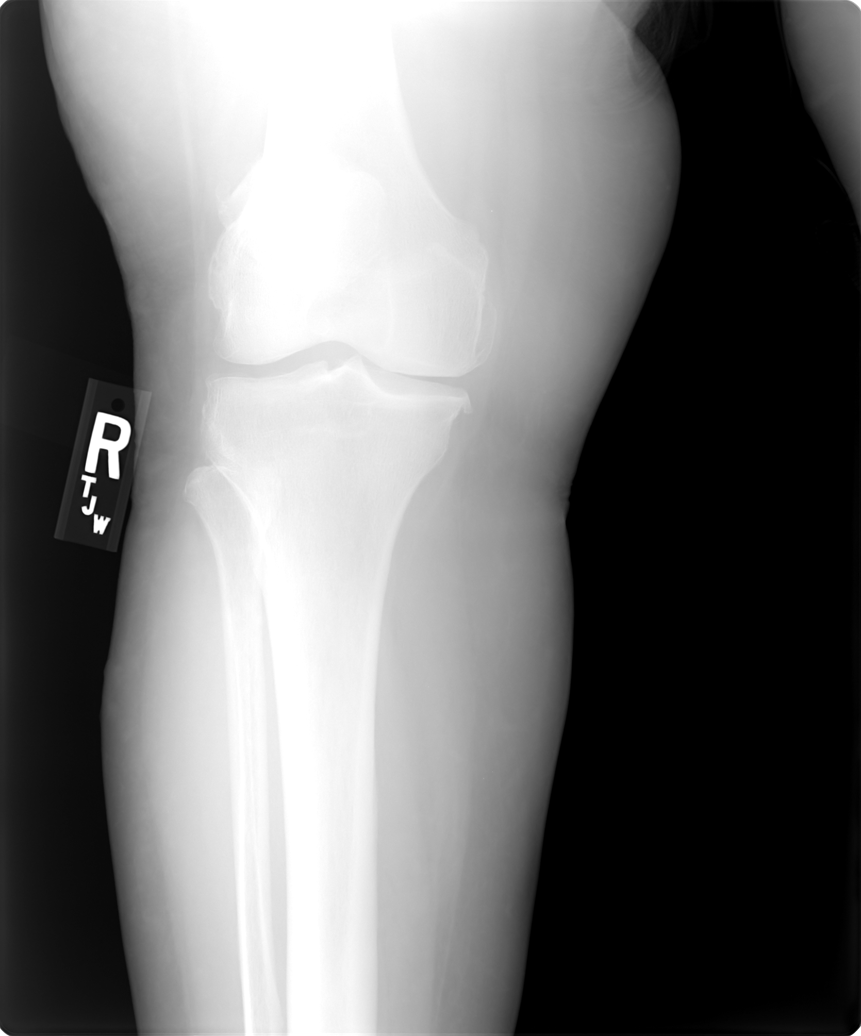

[view not recorded (2 of 5)]
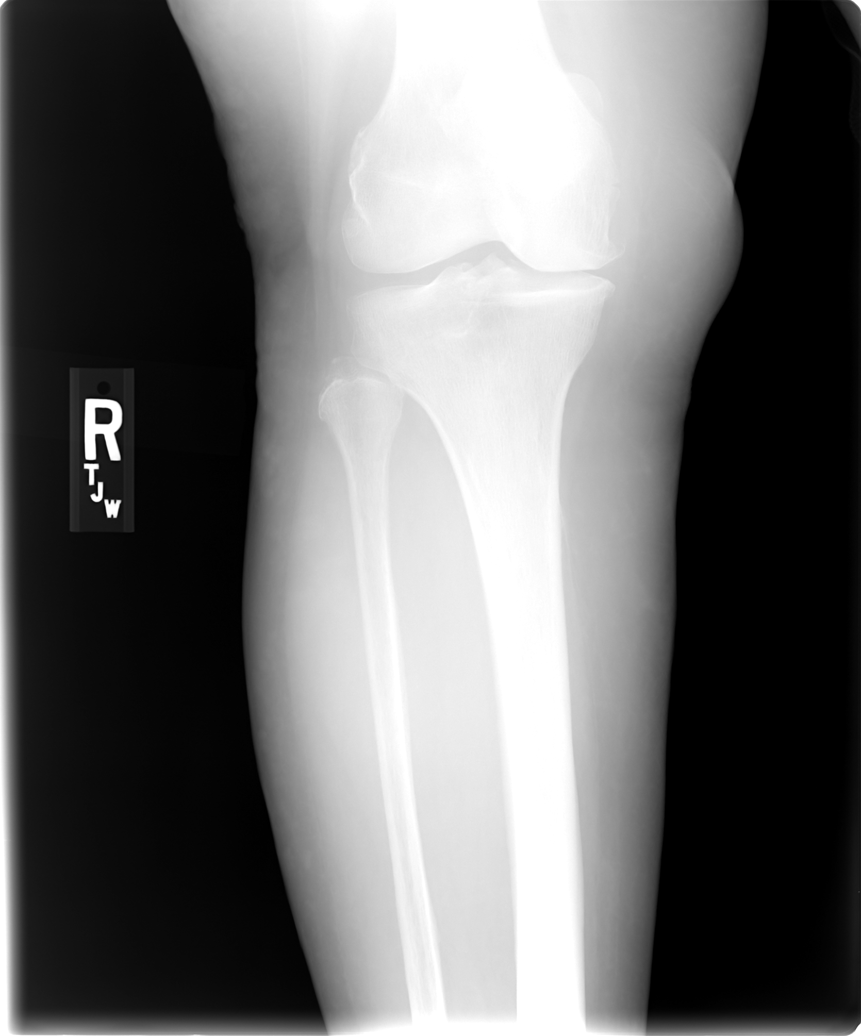

[view not recorded (3 of 5)]
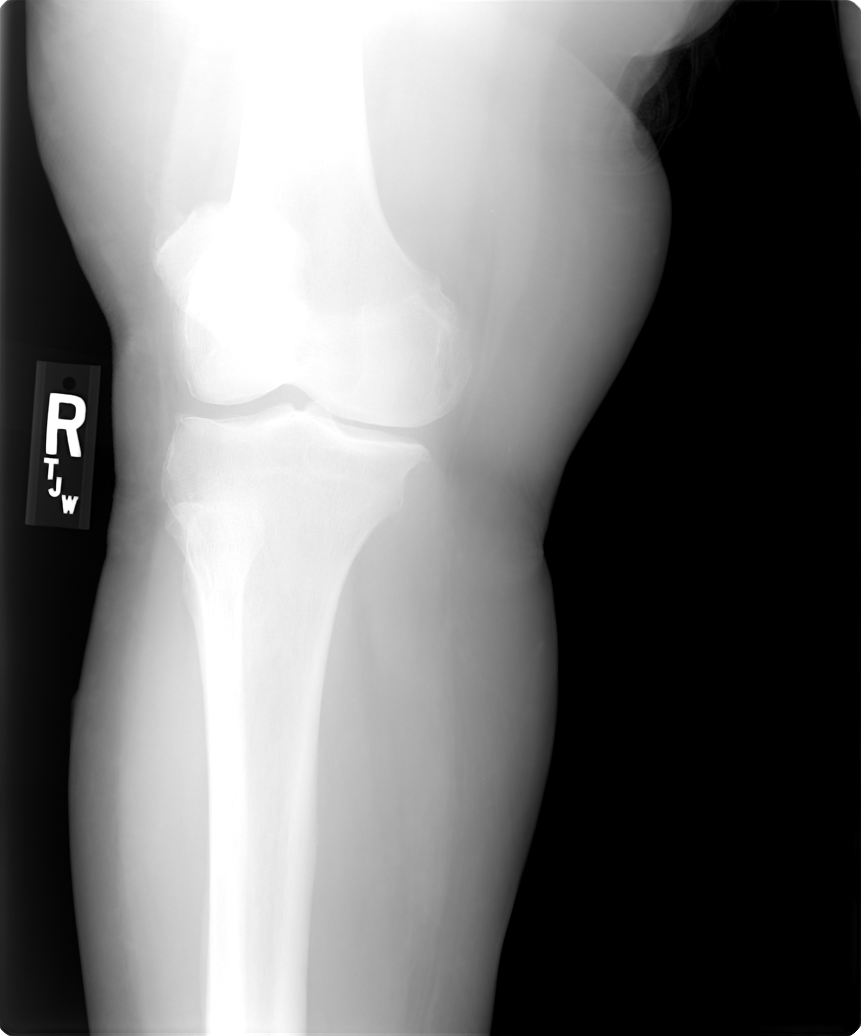

[view not recorded (4 of 5)]
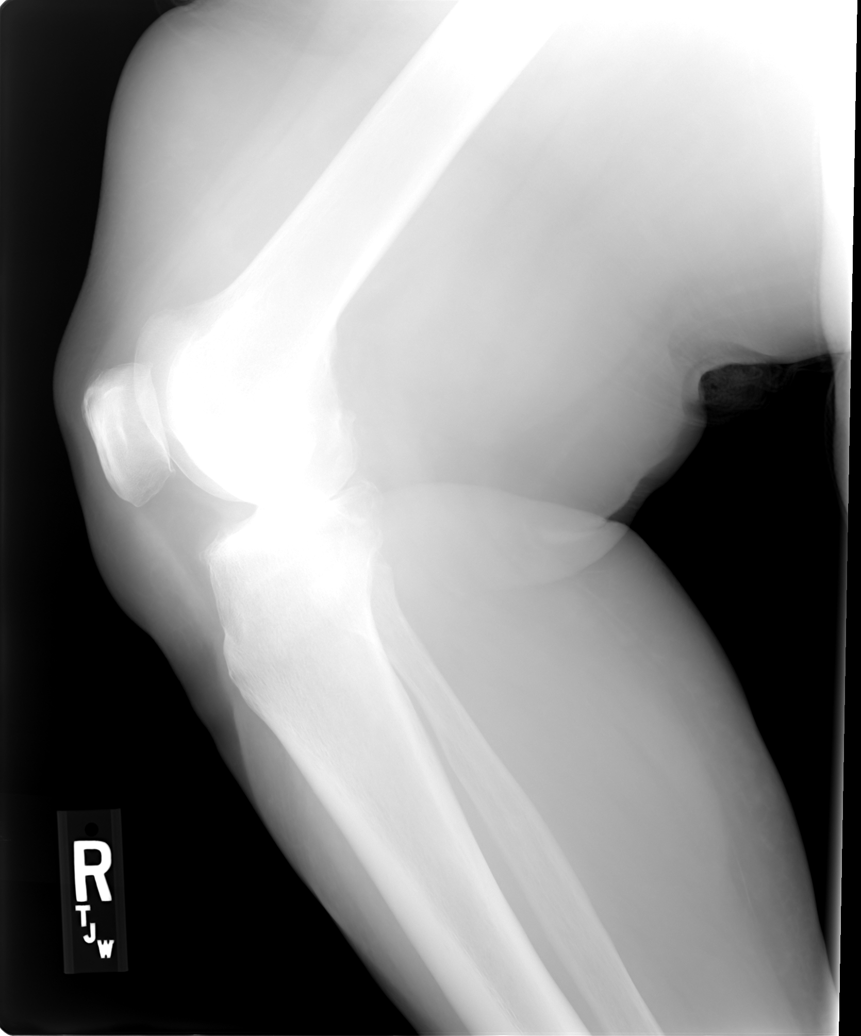

[view not recorded (5 of 5)]
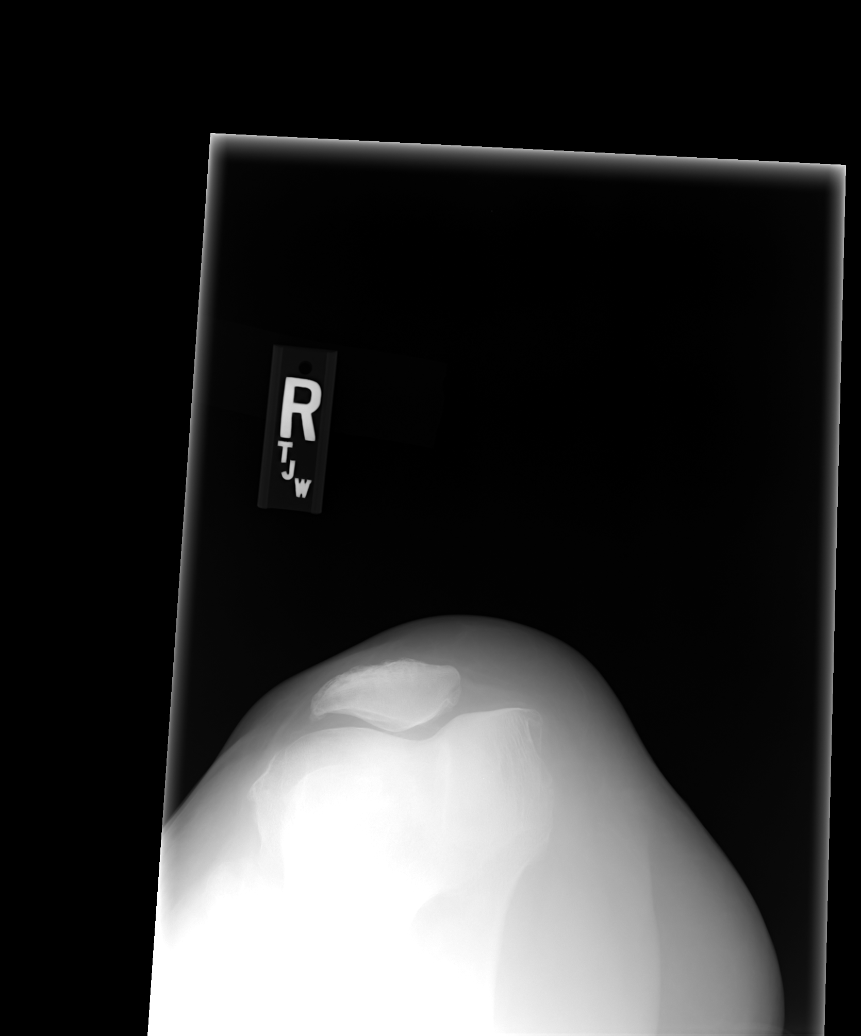

[5 of 5 positions shown; findings below may reference images not displayed]

FINDINGS: The posterior patellar minimal spurring is present.
There is slight narrowing of the lateral joint space.  There is
medial and lateral proximal tibial degenerative spurring.  No
fracture or bony destruction is seen.  No definite joint effusion
is seen.  No loose body or chondrocalcinosis is evident.
IMPRESSION: Degenerative joint arthritic changes are detailed above.

## 2015-06-24 ENCOUNTER — Other Ambulatory Visit: Payer: Self-pay

## 2015-06-24 MED ORDER — ATENOLOL 25 MG PO TABS: 25.0000 mg | ORAL_TABLET | Freq: Every day | ORAL | Status: DC

## 2015-06-24 NOTE — Telephone Encounter (Signed)
pts daughter in law request refill atenolol to [facility]; daughter in law will cb before med is gone to schedule appt with Dr Milinda Antisower.advised done per protocol.

## 2015-10-05 ENCOUNTER — Telehealth: Payer: Self-pay | Admitting: Family Medicine

## 2015-10-05 NOTE — Telephone Encounter (Signed)
Pt called wants refill on atenolol (TENORMIN) 25 MG tablet.  Explained that Daughter in law was to call to schedule appointment with PCP prior to December 2016 90 day refill running out.    Pt explained that she is moving to [place] with daughter in 2 weeks.  Scheduled appointment on 10/11/2015 with Dr. Milinda Antisower so pt can get enough medication until scheduled with new PCP in [place].

## 2015-10-05 NOTE — Telephone Encounter (Signed)
Thanks- I have not seen her since 2015  If she needs pills to get by until she sees me -please send some in  Thanks

## 2015-10-05 NOTE — Telephone Encounter (Signed)
Pt said she has enough med to last until her appt

## 2015-10-06 ENCOUNTER — Telehealth: Payer: Self-pay

## 2015-10-06 MED ORDER — ATENOLOL 25 MG PO TABS
25.0000 mg | ORAL_TABLET | Freq: Every day | ORAL | Status: DC
Start: 2015-10-06 — End: 2015-10-18

## 2015-10-06 NOTE — Telephone Encounter (Signed)
done

## 2015-10-06 NOTE — Telephone Encounter (Addendum)
Yesenia BondsIda 's daughter in law called and said they would like for us to send her prescription for atenolol (TENORMIN) 25 MG tablet in since it is mail order and that way she will not run out, Yesenia Bondsda only has about 7 or 8 pills

## 2015-10-11 ENCOUNTER — Ambulatory Visit: Payer: Medicare PPO | Admitting: Family Medicine

## 2015-10-18 ENCOUNTER — Ambulatory Visit (INDEPENDENT_AMBULATORY_CARE_PROVIDER_SITE_OTHER): Payer: Medicare HMO | Admitting: Family Medicine

## 2015-10-18 ENCOUNTER — Encounter: Payer: Self-pay | Admitting: Family Medicine

## 2015-10-18 VITALS — BP 132/66 | HR 75 | Temp 98.6°F | Ht <= 58 in | Wt 156.2 lb

## 2015-10-18 DIAGNOSIS — E559 Vitamin D deficiency, unspecified: Secondary | ICD-10-CM | POA: Diagnosis not present

## 2015-10-18 DIAGNOSIS — E049 Nontoxic goiter, unspecified: Secondary | ICD-10-CM

## 2015-10-18 DIAGNOSIS — E785 Hyperlipidemia, unspecified: Secondary | ICD-10-CM | POA: Diagnosis not present

## 2015-10-18 DIAGNOSIS — I10 Essential (primary) hypertension: Secondary | ICD-10-CM

## 2015-10-18 DIAGNOSIS — J3089 Other allergic rhinitis: Secondary | ICD-10-CM | POA: Diagnosis not present

## 2015-10-18 DIAGNOSIS — R2689 Other abnormalities of gait and mobility: Secondary | ICD-10-CM | POA: Insufficient documentation

## 2015-10-18 LAB — LIPID PANEL
Cholesterol: 133 mg/dL (ref 0–200)
HDL: 44.4 mg/dL (ref >39.00)
LDL Cholesterol: 76 mg/dL (ref 0–99)
NonHDL: 88.8
Total CHOL/HDL Ratio: 3
Triglycerides: 65 mg/dL (ref 0.0–149.0)
VLDL: 13 mg/dL (ref 0.0–40.0)

## 2015-10-18 LAB — COMPREHENSIVE METABOLIC PANEL
ALBUMIN: 3.9 g/dL (ref 3.5–5.2)
ALK PHOS: 64 U/L (ref 39–117)
ALT: 10 U/L (ref 0–35)
AST: 24 U/L (ref 0–37)
BILIRUBIN TOTAL: 0.5 mg/dL (ref 0.2–1.2)
BUN: 22 mg/dL (ref 6–23)
CO2: 28 mEq/L (ref 19–32)
CREATININE: 0.93 mg/dL (ref 0.40–1.20)
Calcium: 9.8 mg/dL (ref 8.4–10.5)
Chloride: 104 mEq/L (ref 96–112)
GFR: 60.36 mL/min (ref >60.00)
Glucose, Bld: 106 mg/dL — ABNORMAL HIGH (ref 70–99)
Potassium: 4.5 mEq/L (ref 3.5–5.1)
Sodium: 139 mEq/L (ref 135–145)
TOTAL PROTEIN: 7.5 g/dL (ref 6.0–8.3)

## 2015-10-18 LAB — VITAMIN D 25 HYDROXY (VIT D DEFICIENCY, FRACTURES): VITD: 39.76 ng/mL (ref 30.00–100.00)

## 2015-10-18 LAB — CBC WITH DIFFERENTIAL/PLATELET
Basophils Absolute: 0 10*3/uL (ref 0.0–0.1)
Basophils Relative: 0.4 % (ref 0.0–3.0)
Eosinophils Absolute: 0.1 10*3/uL (ref 0.0–0.7)
Eosinophils Relative: 1.3 % (ref 0.0–5.0)
HCT: 40.1 % (ref 36.0–46.0)
Hemoglobin: 13.4 g/dL (ref 12.0–15.0)
Lymphocytes Relative: 25.5 % (ref 12.0–46.0)
Lymphs Abs: 2 10*3/uL (ref 0.7–4.0)
MCHC: 33.3 g/dL (ref 30.0–36.0)
MCV: 88.6 fl (ref 78.0–100.0)
Monocytes Absolute: 0.8 10*3/uL (ref 0.1–1.0)
Monocytes Relative: 10.1 % (ref 3.0–12.0)
Neutro Abs: 4.9 10*3/uL (ref 1.4–7.7)
Neutrophils Relative %: 62.7 % (ref 43.0–77.0)
PLATELETS: 341 10*3/uL (ref 150.0–400.0)
RBC: 4.52 Mil/uL (ref 3.87–5.11)
RDW: 13.6 % (ref 11.5–15.5)
WBC: 7.8 10*3/uL (ref 4.0–10.5)

## 2015-10-18 LAB — TSH: TSH: 0.42 u[IU]/mL (ref 0.35–4.50)

## 2015-10-18 MED ORDER — ATENOLOL 25 MG PO TABS: 25.0000 mg | ORAL_TABLET | Freq: Every day | ORAL | Status: AC

## 2015-10-18 NOTE — Patient Instructions (Signed)
Try your best to stay safe - use your cane , (use your walker on days you feel unsteady)  Take care of yourself Stay as active as you can be to stay strong  Eat regular meals  Labs today  No change in medication  Try to take your vitamin D regularly

## 2015-10-18 NOTE — Progress Notes (Signed)
Subjective:    Patient ID: Yesenia Gomez, female    DOB: 1924-10-20, 82 y.o.   MRN: [id number]  HPI Here for f/u of chronic health problems   Wt is down 1 lb with bmi of 33  Had lost wt and is gaining it back   bp is stable today  No cp or palpitations or headaches or edema  No side effects to medicines  BP Readings from Last 3 Encounters:  10/18/15 132/66  06/16/14 136/82  03/18/13 146/90     On atenolol   Hx of vit D def  Does not always take it now   Due for labs  Hx of high cholesterol Lab Results  Component Value Date   CHOL 150 06/16/2014   HDL 47.60 06/16/2014   LDLCALC 87 06/16/2014   TRIG 79.0 06/16/2014   CHOLHDL 3 06/16/2014    Not eating well  Eating cream cheese muffins - from [employer]   Has a goiter  Lab Results  Component Value Date   TSH 0.71 06/16/2014      She will be moving back to [place] soon - to be closer to family A lot of family issues/medical problems -she worries a lot  Family in turn worries about her - less mobile/worries about falls     Review of Systems Review of Systems  Constitutional: Negative for fever, appetite change, and unexpected weight change.  ENT pos for post nasal drip  Respiratory: Negative for cough and shortness of breath.   Cardiovascular: Negative for cp or palpitations    Gastrointestinal: Negative for nausea, diarrhea and constipation.  Genitourinary: Negative for urgency and frequency.  Skin: Negative for pallor or rash   MSK - uses a cane for ambulation/ chronic R knee pain / arthritis  Neurological: Negative for weakness, light-headedness, numbness and headaches.  Hematological: Negative for adenopathy. Does not bruise/bleed easily.  Psychiatric/Behavioral: Negative for dysphoric mood. The patient is not nervous/anxious.         Objective:   Physical Exam  Constitutional: She appears well-developed and well-nourished. No distress.  obese and well appearing   HENT:  Head: Normocephalic and  atraumatic.  Mouth/Throat: Oropharynx is clear and moist.  Eyes: Conjunctivae and EOM are normal. Pupils are equal, round, and reactive to light.  Neck: Normal range of motion. Neck supple. No JVD present. Carotid bruit is not present.  No change in thyroid exam  Cardiovascular: Normal rate, regular rhythm, normal heart sounds and intact distal pulses.  Exam reveals no gallop.   Pulmonary/Chest: Effort normal and breath sounds normal. No respiratory distress. She has no wheezes. She has no rales.  No crackles  Abdominal: Soft. Bowel sounds are normal. She exhibits no distension, no abdominal bruit and no mass. There is no tenderness.  Musculoskeletal: She exhibits edema. She exhibits no tenderness.  Pt would not allow me to remove shoes to look at feet today  Trace pedal edema with sock line  Lymphadenopathy:    She has no cervical adenopathy.  Neurological: She is alert. She has normal reflexes. No cranial nerve deficit. She exhibits normal muscle tone. Coordination normal.  Skin: Skin is warm and dry. No rash noted. No pallor.  Psychiatric: She has a normal mood and affect. Her mood appears not anxious. Her speech is tangential. She does not exhibit a depressed mood.          Assessment & Plan:   Problem List Items Addressed This Visit      Cardiovascular  and Mediastinum   Essential hypertension - Primary    Refilled atenolol bp in fair control at this time  BP Readings from Last 1 Encounters:  10/18/15 132/66   No changes needed Disc lifstyle change with low sodium diet and exercise   Lab today Pt plans to move to [place] soon to be closer to family      Relevant Medications   atenolol (TENORMIN) 25 MG tablet   Other Relevant Orders   CBC with Differential/Platelet (Completed)   Comprehensive metabolic panel (Completed)   TSH (Completed)   Lipid panel (Completed)     Respiratory   Allergic rhinitis    Pt asked about otc tx - suggested trial of flonase otc for cong and  rhinorrhea         Endocrine   Goiter   Relevant Medications   atenolol (TENORMIN) 25 MG tablet     Other   Hyperlipidemia    Lab today  Pt declines tx  Disc goals for lipids and reasons to control them Rev labs with pt-from last draw  Rev low sat fat diet in detail       Relevant Medications   atenolol (TENORMIN) 25 MG tablet   Other Relevant Orders   Lipid panel (Completed)   Poor balance    Pt is resistant to using a walker -but has one Uses a cane Disc use of a walker-I feel it would be safer  Will not to PT Given info re: fall prev in home       Vitamin D deficiency    Pt states she does not take her vitamin D regularly  Counseled on imp to bone and overall health      Relevant Orders   VITAMIN D 25 Hydroxy (Vit-D Deficiency, Fractures) (Completed)

## 2015-10-18 NOTE — Progress Notes (Signed)
Pre visit review using our clinic review tool, if applicable. No additional management support is needed unless otherwise documented below in the visit note. 

## 2015-10-18 NOTE — Assessment & Plan Note (Signed)
Refilled atenolol bp in fair control at this time  BP Readings from Last 1 Encounters:  10/18/15 132/66   No changes needed Disc lifstyle change with low sodium diet and exercise   Lab today Pt plans to move to [place] soon to be closer to family

## 2015-10-18 NOTE — Assessment & Plan Note (Signed)
Pt is resistant to using a walker -but has one Uses a cane Disc use of a walker-I feel it would be safer  Will not to PT Given info re: fall prev in home

## 2015-10-18 NOTE — Assessment & Plan Note (Signed)
Lab today  Pt declines tx  Disc goals for lipids and reasons to control them Rev labs with pt-from last draw  Rev low sat fat diet in detail

## 2015-10-18 NOTE — Assessment & Plan Note (Signed)
Pt asked about otc tx - suggested trial of flonase otc for cong and rhinorrhea

## 2015-10-18 NOTE — Assessment & Plan Note (Signed)
Pt states she does not take her vitamin D regularly  Counseled on imp to bone and overall health

## 2015-10-19 ENCOUNTER — Encounter: Payer: Self-pay | Admitting: *Deleted
